# Patient Record
Sex: Male | Born: 1940 | Race: White | Hispanic: No | Marital: Single
Health system: Southern US, Community
[De-identification: ages and names within clinical notes are randomized; demographics above are authoritative.]

---

## 2016-11-17 ENCOUNTER — Emergency Department: Payer: Medicare Other

## 2016-11-17 ENCOUNTER — Emergency Department
Admission: EM | Admit: 2016-11-17 | Discharge: 2016-11-17 | Disposition: A | Discharge status: Home / Self Care | Payer: Medicare Other | Attending: Emergency Medicine | Admitting: Emergency Medicine

## 2016-11-17 DIAGNOSIS — R079 Chest pain, unspecified: Secondary | ICD-10-CM | POA: Diagnosis not present

## 2016-11-17 DIAGNOSIS — I10 Essential (primary) hypertension: Secondary | ICD-10-CM | POA: Diagnosis not present

## 2016-11-17 DIAGNOSIS — I480 Paroxysmal atrial fibrillation: Secondary | ICD-10-CM | POA: Insufficient documentation

## 2016-11-17 LAB — CBC
HEMATOCRIT: 44.4 % (ref 40.0–52.0)
HEMOGLOBIN: 15.1 g/dL (ref 13.0–18.0)
MCH: 32.7 pg (ref 26.0–34.0)
MCHC: 34 g/dL (ref 32.0–36.0)
MCV: 96 fL (ref 80.0–100.0)
Platelets: 158 10*3/uL (ref 150–440)
RBC: 4.62 MIL/uL (ref 4.40–5.90)
RDW: 14.9 % — AB (ref 11.5–14.5)
WBC: 9.4 10*3/uL (ref 3.8–10.6)

## 2016-11-17 LAB — BASIC METABOLIC PANEL
Anion gap: 7 (ref 5–15)
BUN: 30 mg/dL — AB (ref 6–20)
CHLORIDE: 110 mmol/L (ref 101–111)
CO2: 24 mmol/L (ref 22–32)
Calcium: 9 mg/dL (ref 8.9–10.3)
Creatinine, Ser: 1.3 mg/dL — ABNORMAL HIGH (ref 0.61–1.24)
GFR calc Af Amer: >60 mL/min (ref >60)
GFR calc non Af Amer: 52 mL/min — ABNORMAL LOW (ref >60)
GLUCOSE: 95 mg/dL (ref 65–99)
POTASSIUM: 5 mmol/L (ref 3.5–5.1)
Sodium: 141 mmol/L (ref 135–145)

## 2016-11-17 LAB — TROPONIN I
TROPONIN I: 0.04 ng/mL — AB (ref <0.03)
Troponin I: 0.04 ng/mL (ref <0.03)

## 2016-11-17 MED ORDER — METOPROLOL TARTRATE 25 MG PO TABS: 12.5000 mg | ORAL_TABLET | Freq: Two times a day (BID) | ORAL | 2 refills | Status: AC

## 2016-11-17 NOTE — ED Triage Notes (Signed)
Pt to triage via w/c with no distress noted; pt reports since yesterday has had some mid CP, nonradiating accomp by [facility]; st hx afib; as of note family member works here in hospital and checked pt who was apparently afib 130's PTA

## 2016-11-17 NOTE — Discharge Instructions (Signed)
You have been seen in the emergency department today for chest pain. Your workup has shown normal results. As we discussed please follow-up with your primary care physician in the next 1-2 days for recheck. Return to the emergency department for any further chest pain, trouble breathing, or any other symptom personally concerning to yourself.  Please follow-up with cardiology by calling the number provided.

## 2016-11-17 NOTE — ED Notes (Signed)
ED Provider at bedside. 

## 2016-11-17 NOTE — ED Provider Notes (Signed)
[facility] Emergency Department Provider Note  Time seen: 7:58 PM  I have reviewed the triage vital signs and the nursing notes.   HISTORY  Chief Complaint Chest Pain    HPI Jesse Dalton is a 76 y.o. male With a past medical history of hypertension, paroxysmal atrial fibrillation, presents to the emergency department for intermittent rapid heartbeat and chest discomfort. According to the patient over the past 2-3 days he has felt his heart start beating very rapidly consistent with prior episodes of atrial fibrillation. Patient states his heart comes out of this rhythm, but it is occurring more often than normal. He states yesterday he began experiencing some mild chest discomfort or shortness of breath during the episodes of fast heart rate, but the chest discomfort alleviated when the heart rate went back to normal. Patient was upstairs in the hospital today, family member is a nurse who hooked him up to a cardiac monitor and noted him to be in atrial fibrillation, they brought him down to the emergency department for evaluation. The patient has converted back to normal sinus rhythm before getting to the department.patient denies any symptoms at this time. States he feels normal. Denies any chest pain, trouble breathing. Denies any nausea or diaphoresis at any time. Denies leg pain or swelling.  No past medical history on file.  There are no active problems to display for this patient.   No past surgical history on file.  Prior to Admission medications   Not on File    No Known Allergies  No family history on file.  Social History Social History  Substance Use Topics  . Smoking status: Not on file  . Smokeless tobacco: Not on file  . Alcohol use Not on file    Review of Systems Constitutional: Negative for fever. Cardiovascular: intermittent mild central chest pain over the past 2 days Respiratory: mild shortness of breath with rapid heart  rate that alleviates when his heart rate slows. Gastrointestinal: Negative for abdominal pain, vomiting  Musculoskeletal: Negative for leg pain or swelling. All other ROS negative  ____________________________________________   PHYSICAL EXAM:  VITAL SIGNS: ED Triage Vitals  Enc Vitals Group     BP 11/17/16 1915 133/83     Pulse --      Resp 11/17/16 1915 18     Temp 11/17/16 1915 97.6 F (36.4 C)     Temp Source 11/17/16 1915 Oral     SpO2 11/17/16 1915 99 %     Weight 11/17/16 1916 176 lb (79.8 kg)     Height 11/17/16 1916  (1.753 m)     Head Circumference --      Peak Flow --      Pain Score 11/17/16 1915 1     Pain Loc --      Pain Edu? --      Excl. in GC? --     Constitutional: Alert and oriented. Well appearing and in no distress. Eyes: Normal exam ENT   Head: Normocephalic and atraumatic   Mouth/Throat: Mucous membranes are moist. Cardiovascular: Normal rate, regular rhythm. No murmur Respiratory: Normal respiratory effort without tachypnea nor retractions. Breath sounds are clear Gastrointestinal: Soft and nontender. No distention.  Musculoskeletal: Nontender with normal range of motion in all extremities. No lower extremity tenderness or edema. Neurologic:  Normal speech and language. No gross focal neurologic deficits  Skin:  Skin is warm, dry and intact.  Psychiatric: Mood and affect are normal.   ____________________________________________  EKG  EKG reviewed and interpreted, so shows sinus bradycardia at 59 bpm, narrow QRS, mild left axis deviation, normal intervals but no concerning ST changes.  ____________________________________________    RADIOLOGY  chest x-ray shows no acute disease.  ____________________________________________   INITIAL IMPRESSION / ASSESSMENT AND PLAN / ED COURSE  Pertinent labs & imaging results that were available during my care of the patient were reviewed by me and considered in my medical decision  making (see chart for details).  patient presents to the emergency department for intermittent atrial fibrillation and chest discomfort occurring over the past 2-3 days. Patient states a history of atrial fibrillation but states it was rare for him to go in and out of it, he states over the past several days he has been going in and out of it fairly frequently. States at times he will have a rapid heart rate with some mild chest discomfort and shortness of breath, but the heart rate will do normalize and the patient is chest pain and shortness of breath alleviates. Currently the patient denies any symptoms including any chest pain or shortness of breath. Denies any nausea or diaphoresis at any point.family member states the patient did take an extra metoprolol this afternoon, normally only takes 12.5 mg once in the morning. Differential at this time would include paroxysmal atrial fibrillation, PVCs or other ectopic beats, arrhythmia, ACS, intrathoracic pathology such as pneumonia. Patient's labs are largely within normal limits, troponin is 0.04, slightly elevated. X-rays reassuring, EKG is reassuring normal sinus rhythm around 60 bpm. We will continue to closely monitor the patient on telemetry. Plan to repeat a cardiac enzyme in 2 hours.  repeat troponin is unchanged. Patient will be discharged home with metoprolol 12.5 mg twice daily and cardiology follow-up.  ____________________________________________   FINAL CLINICAL IMPRESSION(S) / ED DIAGNOSES  chest pain paroxysmal atrial fibrillation    Minna Antis, MD 11/17/16 2247

## 2016-11-21 ENCOUNTER — Telehealth: Payer: Self-pay

## 2016-11-21 NOTE — Telephone Encounter (Signed)
Lmov for patient to call back °They were seen in ED on 11/17/16 for CP °Will try again at a later time °

## 2016-11-30 NOTE — Telephone Encounter (Signed)
Lmov for patient to call back They were seen in ED on 11/17/16 for CP Will try again at a later time

## 2016-12-12 NOTE — Telephone Encounter (Signed)
Lmov for patient to call back They were seen in ED on 11/17/16 for CP Will try again at a later time

## 2019-04-11 IMAGING — CR DG CHEST 2V
2 series · 2 of 2 positions shown · non-contrast
Comparison: None.

CLINICAL DATA: Mid chest pain since yesterday with some
shortness-of-breath.

EXAM:
CHEST  2 VIEW

[chest pa]
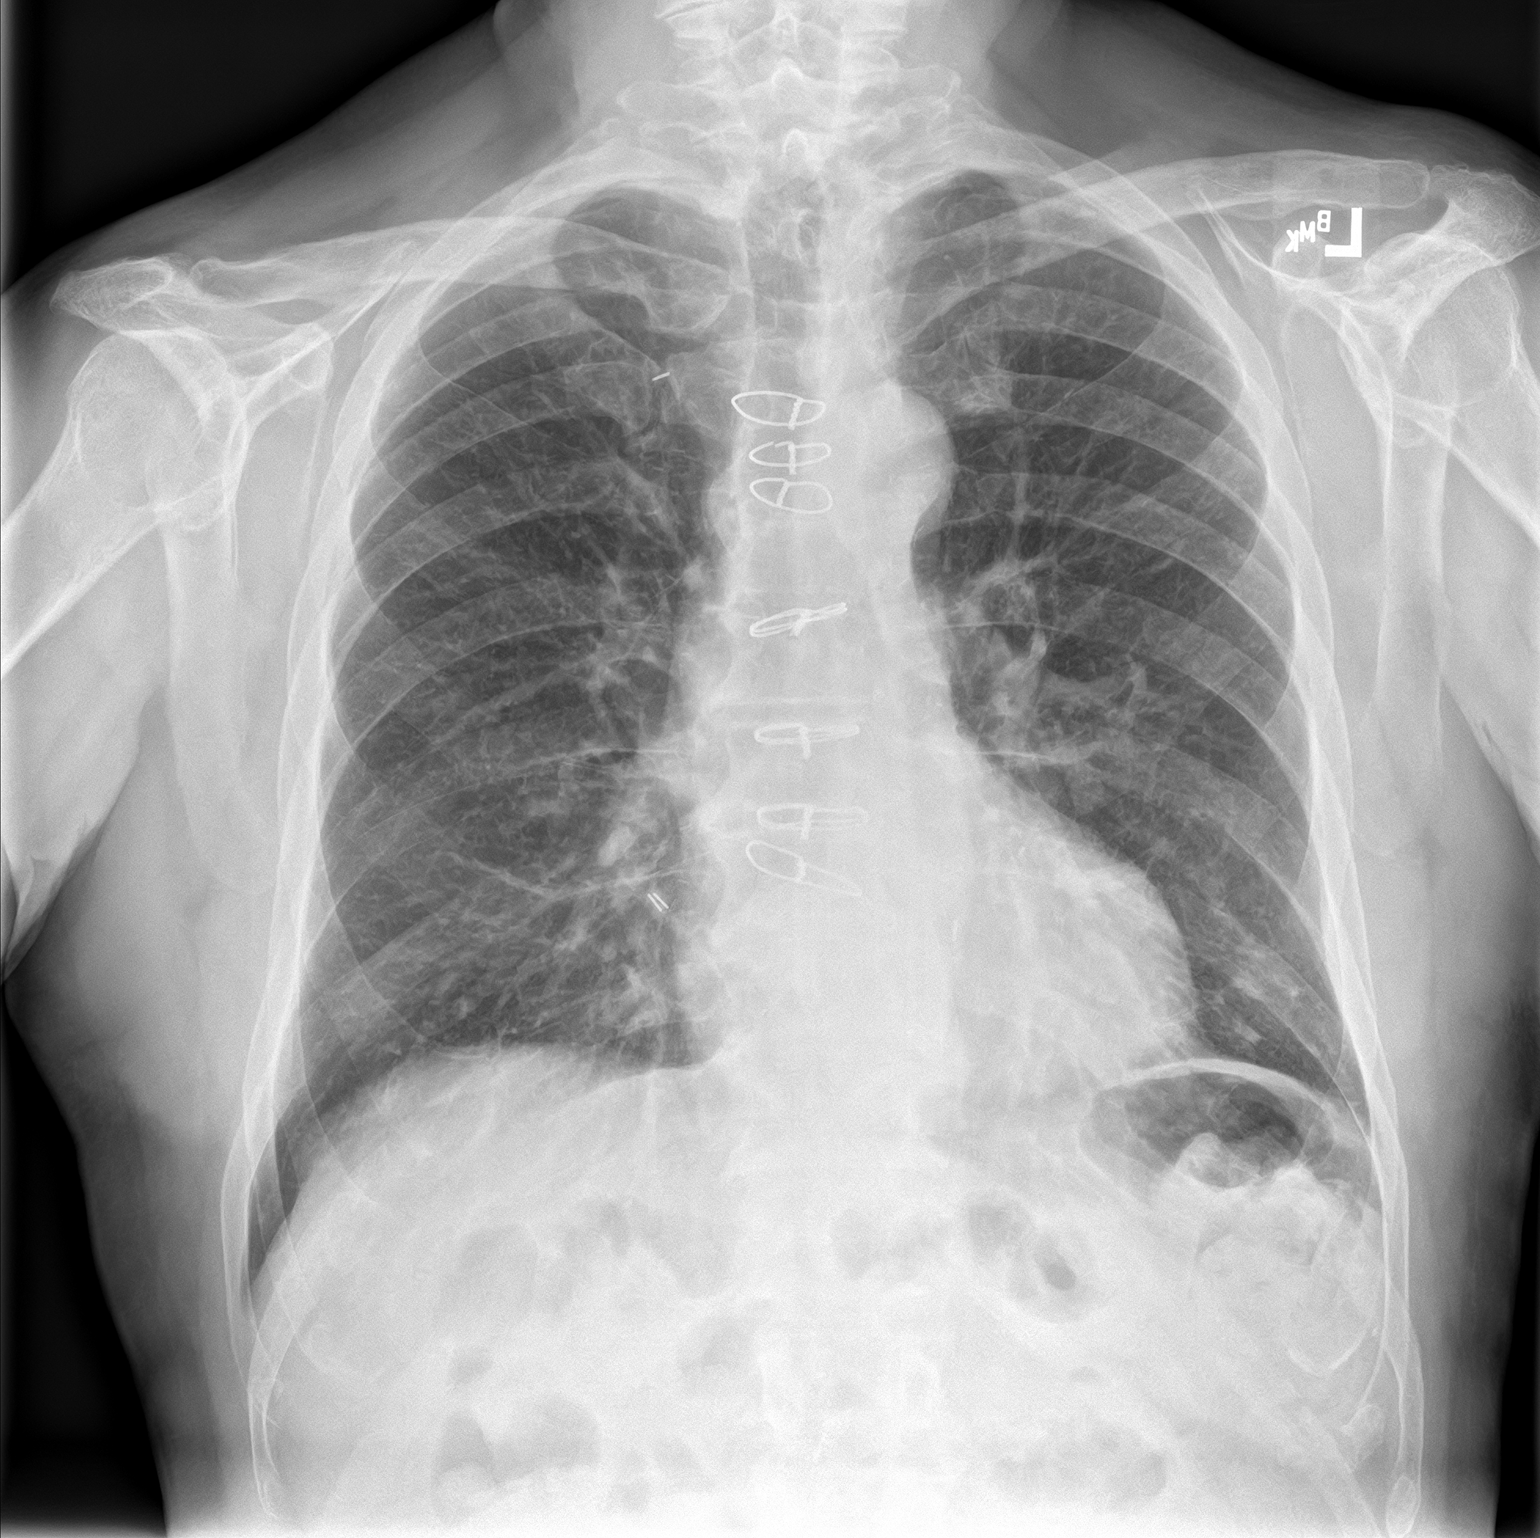

[chest lat]
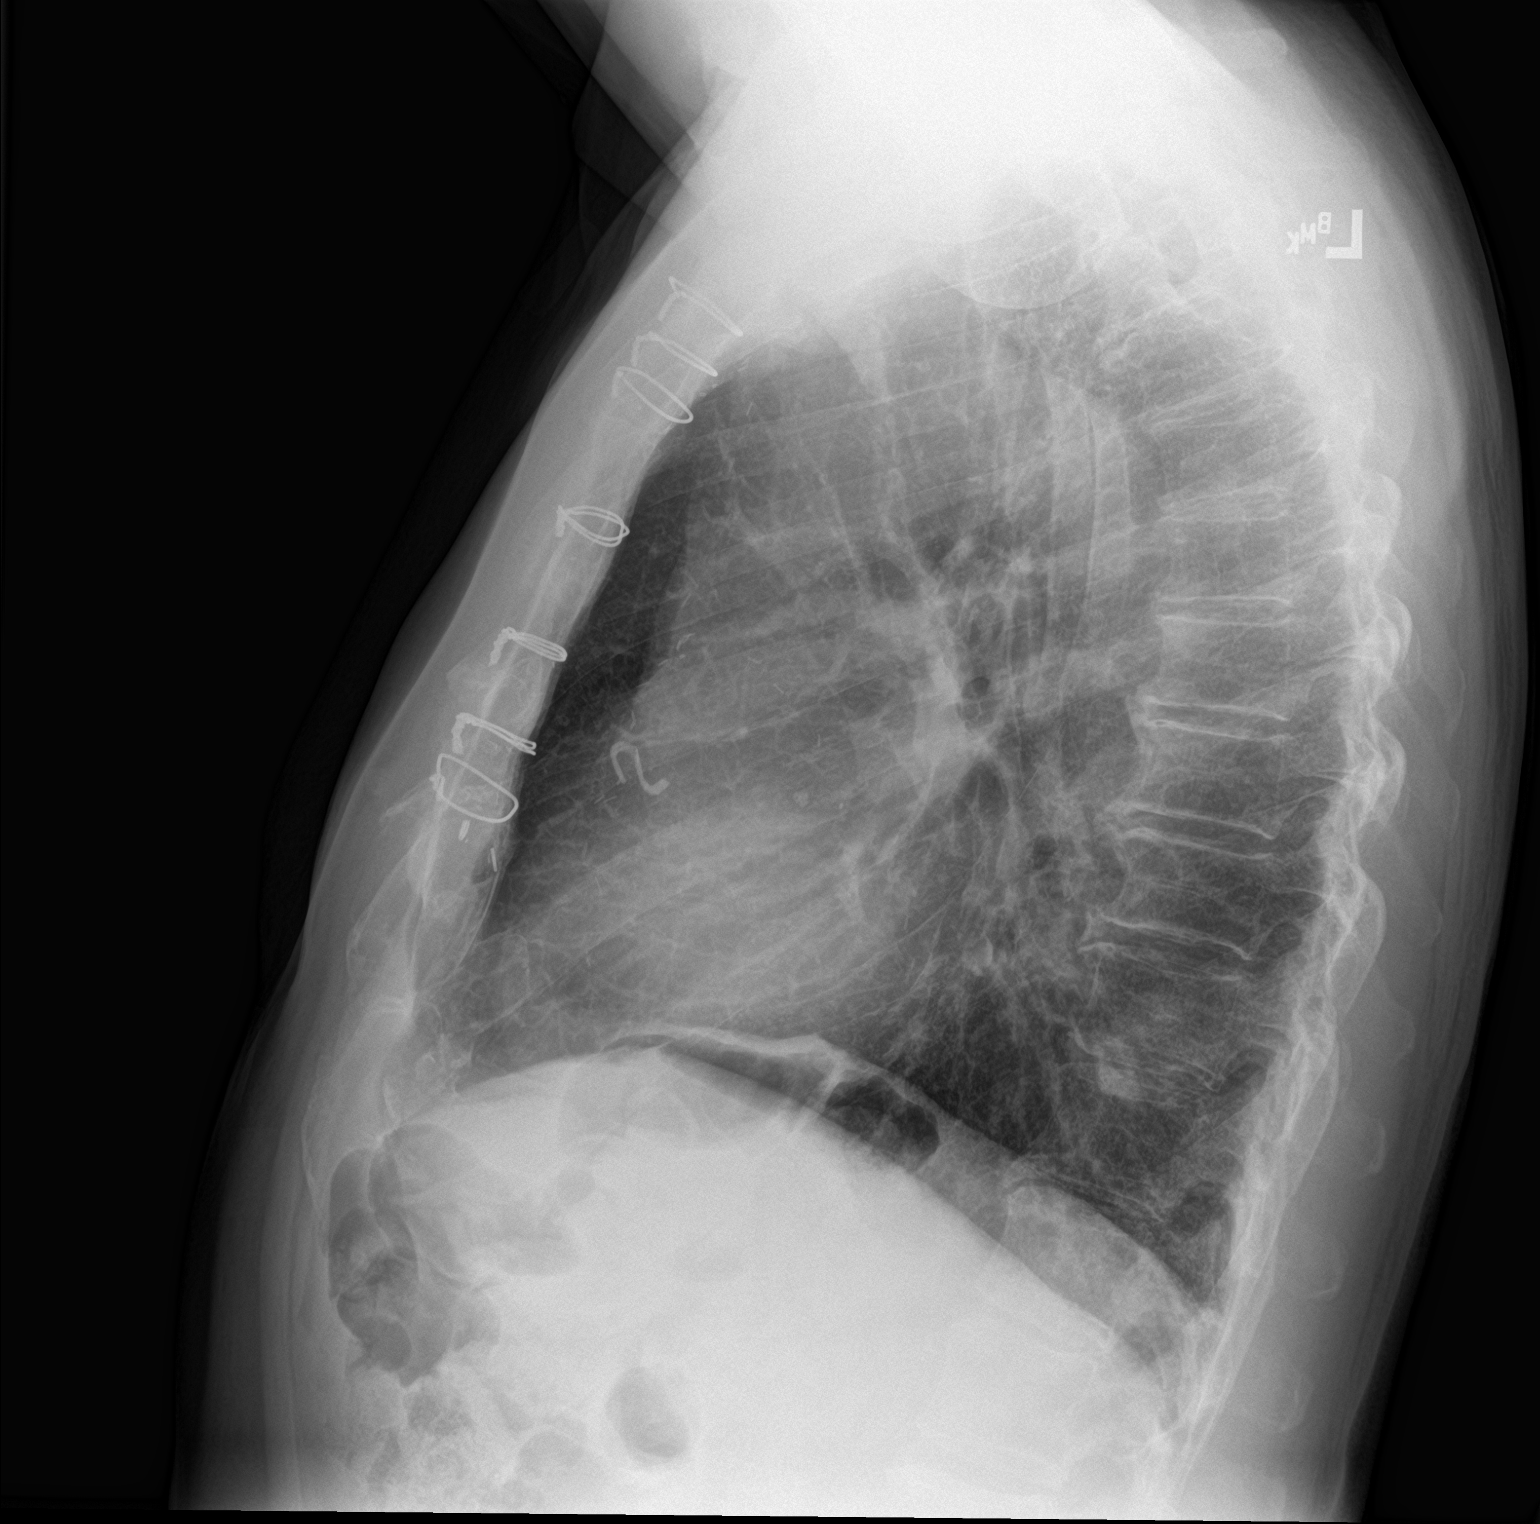

[2 of 2 positions shown; findings below may reference images not displayed]

FINDINGS: Sternotomy wires are present. Lungs are adequately inflated without
focal airspace consolidation or effusion. Cardiomediastinal
silhouette is within normal. There are mild degenerate changes of
the spine.
IMPRESSION: No active cardiopulmonary disease.
